# Patient Record
Sex: Male | Born: 1995
Health system: Southern US, Community
[De-identification: ages and names within clinical notes are randomized; demographics above are authoritative.]

## PROBLEM LIST (undated history)

## (undated) DIAGNOSIS — J45909 Unspecified asthma, uncomplicated: Secondary | ICD-10-CM

---
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
## 2017-06-27 ENCOUNTER — Other Ambulatory Visit
Admit: 2017-06-27 | Discharge: 2017-06-27 | Disposition: A | Discharge status: Home / Self Care | Payer: Worker's Compensation | Attending: Family Medicine | Admitting: Family Medicine

## 2017-12-10 ENCOUNTER — Emergency Department
Admission: EM | Admit: 2017-12-10 | Discharge: 2017-12-11 | Disposition: A | Discharge status: Home / Self Care | Payer: No Typology Code available for payment source | Attending: Emergency Medicine | Admitting: Emergency Medicine

## 2017-12-10 DIAGNOSIS — Y9389 Activity, other specified: Secondary | ICD-10-CM | POA: Diagnosis not present

## 2017-12-10 DIAGNOSIS — Y929 Unspecified place or not applicable: Secondary | ICD-10-CM | POA: Insufficient documentation

## 2017-12-10 DIAGNOSIS — Y990 Civilian activity done for income or pay: Secondary | ICD-10-CM | POA: Diagnosis not present

## 2017-12-10 DIAGNOSIS — Z87891 Personal history of nicotine dependence: Secondary | ICD-10-CM | POA: Diagnosis not present

## 2017-12-10 DIAGNOSIS — S70372A Other superficial bite of left thigh, initial encounter: Secondary | ICD-10-CM | POA: Insufficient documentation

## 2017-12-10 DIAGNOSIS — Z7721 Contact with and (suspected) exposure to potentially hazardous body fluids: Secondary | ICD-10-CM | POA: Diagnosis not present

## 2017-12-10 DIAGNOSIS — J45909 Unspecified asthma, uncomplicated: Secondary | ICD-10-CM | POA: Diagnosis not present

## 2017-12-10 DIAGNOSIS — W503XXA Accidental bite by another person, initial encounter: Secondary | ICD-10-CM

## 2017-12-10 HISTORY — DX: Unspecified asthma, uncomplicated: J45.909

## 2017-12-10 MED ORDER — AMOXICILLIN-POT CLAVULANATE 875-125 MG PO TABS
1.0000 | ORAL_TABLET | Freq: Once | ORAL | Status: AC
  Administered 2017-12-11: 1 via ORAL
  Filled 2017-12-10: qty 1
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
## 2017-12-11 LAB — CBC WITH DIFFERENTIAL/PLATELET
BASOS PCT: 1 %
Basophils Absolute: 0.1 10*3/uL (ref 0–0.1)
Eosinophils Absolute: 0.1 10*3/uL (ref 0–0.7)
Eosinophils Relative: 1 %
HCT: 46.1 % (ref 40.0–52.0)
Hemoglobin: 15.9 g/dL (ref 13.0–18.0)
Lymphocytes Relative: 23 %
Lymphs Abs: 2.2 10*3/uL (ref 1.0–3.6)
MCH: 31 pg (ref 26.0–34.0)
MCHC: 34.5 g/dL (ref 32.0–36.0)
MCV: 89.9 fL (ref 80.0–100.0)
MONO ABS: 0.5 10*3/uL (ref 0.2–1.0)
MONOS PCT: 5 %
Neutro Abs: 6.7 10*3/uL — ABNORMAL HIGH (ref 1.4–6.5)
Neutrophils Relative %: 70 %
Platelets: 237 10*3/uL (ref 150–440)
RBC: 5.13 MIL/uL (ref 4.40–5.90)
RDW: 13.2 % (ref 11.5–14.5)
WBC: 9.6 10*3/uL (ref 3.8–10.6)

## 2017-12-11 LAB — COMPREHENSIVE METABOLIC PANEL
ALBUMIN: 5.1 g/dL — AB (ref 3.5–5.0)
ALT: 31 U/L (ref 0–44)
ANION GAP: 10 (ref 5–15)
AST: 26 U/L (ref 15–41)
Alkaline Phosphatase: 63 U/L (ref 38–126)
BILIRUBIN TOTAL: 2 mg/dL — AB (ref 0.3–1.2)
BUN: 18 mg/dL (ref 6–20)
CALCIUM: 9.4 mg/dL (ref 8.9–10.3)
CHLORIDE: 104 mmol/L (ref 98–111)
CO2: 26 mmol/L (ref 22–32)
Creatinine, Ser: 1.38 mg/dL — ABNORMAL HIGH (ref 0.61–1.24)
Glucose, Bld: 96 mg/dL (ref 70–99)
POTASSIUM: 3.4 mmol/L — AB (ref 3.5–5.1)
Sodium: 140 mmol/L (ref 135–145)
Total Protein: 7.6 g/dL (ref 6.5–8.1)

## 2017-12-11 LAB — RAPID HIV SCREEN (HIV 1/2 AB+AG)
HIV 1/2 ANTIBODIES: NONREACTIVE
HIV-1 P24 Antigen - HIV24: NONREACTIVE

## 2017-12-11 MED ORDER — AMOXICILLIN-POT CLAVULANATE 875-125 MG PO TABS: 1.0000 | ORAL_TABLET | Freq: Two times a day (BID) | ORAL | 0 refills | Status: AC

## 2017-12-11 MED ORDER — BACITRACIN ZINC 500 UNIT/GM EX OINT
TOPICAL_OINTMENT | Freq: Once | CUTANEOUS | Status: AC
  Administered 2017-12-11: 1 via TOPICAL
  Filled 2017-12-11: qty 0.9

## 2017-12-11 NOTE — Discharge Instructions (Signed)
Please keep the wound on your left thigh clean and you may use a light coating of over-the-counter antibiotic ointment such as bacitracin and keep the wound covered up with a Band-Aid.  Take the prescribed antibiotics to try to prevent infection for the next 5 days.  Follow-up with your occupational health department regarding the testing that was performed tonight.  Return to the emergency department if you develop new or worsening symptoms that concern you.

## 2018-10-07 ENCOUNTER — Telehealth: Payer: Self-pay

## 2018-10-07 NOTE — Telephone Encounter (Signed)
Called and scheduled pt for post exposure labs

## 2018-10-11 ENCOUNTER — Other Ambulatory Visit: Payer: Self-pay

## 2018-10-11 DIAGNOSIS — Z7721 Contact with and (suspected) exposure to potentially hazardous body fluids: Secondary | ICD-10-CM

## 2018-10-11 NOTE — Progress Notes (Signed)
94month post exposure follow up labs.  AMD

## 2018-10-12 LAB — HIV ANTIBODY (ROUTINE TESTING W REFLEX): HIV Screen 4th Generation wRfx: NONREACTIVE

## 2018-10-12 LAB — HCV COMMENT:

## 2018-10-12 LAB — HEPATITIS C ANTIBODY (REFLEX): HCV Ab: <0.1 s/co ratio (ref 0.0–0.9)

## 2019-02-27 ENCOUNTER — Ambulatory Visit: Payer: Self-pay

## 2019-07-25 ENCOUNTER — Ambulatory Visit: Payer: Self-pay

## 2019-07-25 ENCOUNTER — Other Ambulatory Visit: Payer: Self-pay

## 2019-07-25 DIAGNOSIS — Z0000 Encounter for general adult medical examination without abnormal findings: Secondary | ICD-10-CM

## 2019-07-25 LAB — POCT URINALYSIS DIPSTICK
Bilirubin, UA: NEGATIVE
Blood, UA: NEGATIVE
Glucose, UA: NEGATIVE
Ketones, UA: NEGATIVE
Leukocytes, UA: NEGATIVE
Nitrite, UA: NEGATIVE
Protein, UA: NEGATIVE
Spec Grav, UA: 1.025 (ref 1.010–1.025)
Urobilinogen, UA: 0.2 E.U./dL
pH, UA: 6 (ref 5.0–8.0)

## 2019-07-25 NOTE — Progress Notes (Signed)
Presents for pre-employment drug screen. Specimen collected using [facility] Chain of Custody form for [facility] account number [id number]. Specimen ID [id number]  AMD

## 2019-07-28 LAB — CMP12+LP+TP+TSH+6AC+CBC/D/PLT
ALT: 24 IU/L (ref 0–44)
AST: 15 IU/L (ref 0–40)
Albumin/Globulin Ratio: 2.3 — ABNORMAL HIGH (ref 1.2–2.2)
Albumin: 4.8 g/dL (ref 4.1–5.2)
Alkaline Phosphatase: 64 IU/L (ref 48–121)
BUN/Creatinine Ratio: 17 (ref 9–20)
BUN: 19 mg/dL (ref 6–20)
Basophils Absolute: 0.1 10*3/uL (ref 0.0–0.2)
Basos: 1 %
Bilirubin Total: 2.1 mg/dL — ABNORMAL HIGH (ref 0.0–1.2)
Calcium: 9.3 mg/dL (ref 8.7–10.2)
Chloride: 98 mmol/L (ref 96–106)
Chol/HDL Ratio: 2.9 ratio (ref 0.0–5.0)
Cholesterol, Total: 135 mg/dL (ref 100–199)
Creatinine, Ser: 1.13 mg/dL (ref 0.76–1.27)
EOS (ABSOLUTE): 0.1 10*3/uL (ref 0.0–0.4)
Eos: 1 %
Estimated CHD Risk: <0.5 times avg. (ref 0.0–1.0)
Free Thyroxine Index: 1.5 (ref 1.2–4.9)
GFR calc Af Amer: 104 mL/min/{1.73_m2} (ref >59)
GFR calc non Af Amer: 90 mL/min/{1.73_m2} (ref >59)
GGT: 21 IU/L (ref 0–65)
Globulin, Total: 2.1 g/dL (ref 1.5–4.5)
Glucose: 97 mg/dL (ref 65–99)
HDL: 46 mg/dL (ref >39)
Hematocrit: 42.5 % (ref 37.5–51.0)
Hemoglobin: 15 g/dL (ref 13.0–17.7)
Immature Grans (Abs): 0 10*3/uL (ref 0.0–0.1)
Immature Granulocytes: 0 %
Iron: 94 ug/dL (ref 38–169)
LDH: 180 IU/L (ref 121–224)
LDL Chol Calc (NIH): 71 mg/dL (ref 0–99)
Lymphocytes Absolute: 1.5 10*3/uL (ref 0.7–3.1)
Lymphs: 20 %
MCH: 31.1 pg (ref 26.6–33.0)
MCHC: 35.3 g/dL (ref 31.5–35.7)
MCV: 88 fL (ref 79–97)
Monocytes Absolute: 0.4 10*3/uL (ref 0.1–0.9)
Monocytes: 5 %
Neutrophils Absolute: 5.5 10*3/uL (ref 1.4–7.0)
Neutrophils: 73 %
Phosphorus: 4.4 mg/dL — ABNORMAL HIGH (ref 2.8–4.1)
Platelets: 232 10*3/uL (ref 150–450)
Potassium: 3.9 mmol/L (ref 3.5–5.2)
RBC: 4.82 x10E6/uL (ref 4.14–5.80)
RDW: 12.4 % (ref 11.6–15.4)
Sodium: 138 mmol/L (ref 134–144)
T3 Uptake Ratio: 26 % (ref 24–39)
T4, Total: 5.7 ug/dL (ref 4.5–12.0)
TSH: 1.66 u[IU]/mL (ref 0.450–4.500)
Total Protein: 6.9 g/dL (ref 6.0–8.5)
Triglycerides: 97 mg/dL (ref 0–149)
Uric Acid: 4.7 mg/dL (ref 3.8–8.4)
VLDL Cholesterol Cal: 18 mg/dL (ref 5–40)
WBC: 7.6 10*3/uL (ref 3.4–10.8)

## 2019-07-28 LAB — QUANTIFERON-TB GOLD PLUS
QuantiFERON Mitogen Value: >10 IU/mL
QuantiFERON Nil Value: 0 IU/mL
QuantiFERON TB1 Ag Value: 0 IU/mL
QuantiFERON TB2 Ag Value: 0 IU/mL
QuantiFERON-TB Gold Plus: NEGATIVE

## 2019-08-01 ENCOUNTER — Other Ambulatory Visit: Payer: Self-pay

## 2019-08-01 ENCOUNTER — Encounter: Payer: Self-pay | Admitting: Emergency Medicine

## 2019-08-01 ENCOUNTER — Ambulatory Visit: Payer: Self-pay | Admitting: Emergency Medicine

## 2019-08-01 VITALS — BP 114/65 | HR 60 | Temp 96.0°F | Resp 12 | Ht 72.0 in | Wt 191.0 lb

## 2019-08-01 DIAGNOSIS — Z0000 Encounter for general adult medical examination without abnormal findings: Secondary | ICD-10-CM

## 2019-08-01 NOTE — Progress Notes (Signed)
[facility] Provider Note       Time seen: 2:30 PM   I have reviewed the vital signs and the nursing notes.  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 male with a history of asthma who presents today for annual physical examination.  He has no complaints, no recent illness.  Past Medical History:  Diagnosis Date  . Asthma     History reviewed. No pertinent surgical history.  Allergies Patient has no known allergies.  Review of Systems Constitutional: Negative for fever. Cardiovascular: Negative for chest pain. Respiratory: Negative for shortness of breath. Gastrointestinal: Negative for abdominal pain, vomiting and diarrhea. Musculoskeletal: Negative for back pain. Skin: Negative for rash. Neurological: Negative for headaches, focal weakness or numbness.  All systems negative/normal/unremarkable except as stated in the HPI  ____________________________________________   PHYSICAL EXAM:  VITAL SIGNS: Vitals:   08/01/19 1343  BP: 114/65  Pulse: 60  Resp: 12  Temp: (!) 96 F (35.6 C)  SpO2: 98%    Constitutional: Alert and oriented. Well appearing and in no distress. Eyes: Conjunctivae are normal. Normal extraocular movements. ENT      Head: Normocephalic and atraumatic.      Nose: No congestion/rhinnorhea.      Mouth/Throat: Mucous membranes are moist.      Neck: No stridor. Cardiovascular: Normal rate, regular rhythm. No murmurs, rubs, or gallops. Respiratory: Normal respiratory effort without tachypnea nor retractions. Breath sounds are clear and equal bilaterally. No wheezes/rales/rhonchi. Gastrointestinal: Soft and nontender. Normal bowel sounds Musculoskeletal: Nontender with normal range of motion in extremities. No lower extremity tenderness nor edema. Neurologic:  Normal speech and language. No gross focal neurologic  deficits are appreciated.  Skin:  Skin is warm, dry and intact. No rash noted. Psychiatric: Speech and behavior are normal.  ____________________________________________   LABS (pertinent positives/negatives)  Recent Results (from the past 2160 hour(s))  QuantiFERON-TB Gold Plus     Status: None   Collection Time: 07/25/19  2:13 PM  Result Value Ref Range   QuantiFERON Incubation Incubation performed.    QuantiFERON Criteria Comment     Comment: The QuantiFERON-TB Gold Plus result is determined by subtracting the Nil value from either TB antigen (Ag) tube. The mitogen tube serves as a control for the test.    QuantiFERON TB1 Ag Value 0.00 IU/mL   QuantiFERON TB2 Ag Value 0.00 IU/mL   QuantiFERON Nil Value 0.00 IU/mL   QuantiFERON Mitogen Value >10.00 IU/mL   QuantiFERON-TB Gold Plus Negative Negative    Comment: Chemiluminescence immunoassay methodology  CMP12+LP+TP+TSH+6AC+CBC/D/Plt     Status: Abnormal   Collection Time: 07/25/19  2:13 PM  Result Value Ref Range   Glucose 97 65 - 99 mg/dL   Uric Acid 4.7 3.8 - 8.4 mg/dL    Comment:            Therapeutic target for gout patients: <6.0   BUN 19 6 - 20 mg/dL   Creatinine, Ser 1.13 0.76 - 1.27 mg/dL   GFR calc non Af Amer 90 >59 mL/min/1.73   GFR calc Af Amer 104 >59 mL/min/1.73    Comment: **[facility] currently reports eGFR in compliance with the current**   recommendations of the [employer]. [facility] will   update reporting as new guidelines are published from the NKF-ASN   Task force.    BUN/Creatinine Ratio 17 9 - 20  Sodium 138 134 - 144 mmol/L   Potassium 3.9 3.5 - 5.2 mmol/L   Chloride 98 96 - 106 mmol/L   Calcium 9.3 8.7 - 10.2 mg/dL   Phosphorus 4.4 (H) 2.8 - 4.1 mg/dL   Total Protein 6.9 6.0 - 8.5 g/dL   Albumin 4.8 4.1 - 5.2 g/dL   Globulin, Total 2.1 1.5 - 4.5 g/dL   Albumin/Globulin Ratio 2.3 (H) 1.2 - 2.2   Bilirubin Total 2.1 (H) 0.0 - 1.2 mg/dL   Alkaline Phosphatase 64 48 - 121 IU/L     Comment:               **Please note reference interval change**   LDH 180 121 - 224 IU/L   AST 15 0 - 40 IU/L   ALT 24 0 - 44 IU/L   GGT 21 0 - 65 IU/L   Iron 94 38 - 169 ug/dL   Cholesterol, Total 135 100 - 199 mg/dL   Triglycerides 97 0 - 149 mg/dL   HDL 46 >39 mg/dL   VLDL Cholesterol Cal 18 5 - 40 mg/dL   LDL Chol Calc (NIH) 71 0 - 99 mg/dL   Chol/HDL Ratio 2.9 0.0 - 5.0 ratio    Comment:                                   T. Chol/HDL Ratio                                             Men  Women                               1/2 Avg.Risk  3.4    3.3                                   Avg.Risk  5.0    4.4                                2X Avg.Risk  9.6    7.1                                3X Avg.Risk 23.4   11.0    Estimated CHD Risk  < 0.5 0.0 - 1.0 times avg.    Comment: The CHD Risk is based on the T. Chol/HDL ratio. Other factors affect CHD Risk such as hypertension, smoking, diabetes, severe obesity, and family history of premature CHD.    TSH 1.660 0.450 - 4.500 uIU/mL   T4, Total 5.7 4.5 - 12.0 ug/dL   T3 Uptake Ratio 26 24 - 39 %   Free Thyroxine Index 1.5 1.2 - 4.9   WBC 7.6 3.4 - 10.8 x10E3/uL   RBC 4.82 4.14 - 5.80 x10E6/uL   Hemoglobin 15.0 13.0 - 17.7 g/dL   Hematocrit 42.5 37.5 - 51.0 %   MCV 88 79 - 97 fL   MCH 31.1 26.6 - 33.0 pg   MCHC 35.3 31.5 - 35.7 g/dL   RDW 12.4 11.6 - 15.4 %  Platelets 232 150 - 450 x10E3/uL   Neutrophils 73 Not Estab. %   Lymphs 20 Not Estab. %   Monocytes 5 Not Estab. %   Eos 1 Not Estab. %   Basos 1 Not Estab. %   Neutrophils Absolute 5.5 1.4 - 7.0 x10E3/uL   Lymphocytes Absolute 1.5 0.7 - 3.1 x10E3/uL   Monocytes Absolute 0.4 0.1 - 0.9 x10E3/uL   EOS (ABSOLUTE) 0.1 0.0 - 0.4 x10E3/uL   Basophils Absolute 0.1 0.0 - 0.2 x10E3/uL   Immature Granulocytes 0 Not Estab. %   Immature Grans (Abs) 0.0 0.0 - 0.1 x10E3/uL  POCT urinalysis dipstick     Status: None   Collection Time: 07/25/19  3:38 PM  Result Value Ref Range    Color, UA Yellow    Clarity, UA Clear    Glucose, UA Negative Negative   Bilirubin, UA Negative    Ketones, UA Negative    Spec Grav, UA 1.025 1.010 - 1.025   Blood, UA Negative    pH, UA 6.0 5.0 - 8.0   Protein, UA Negative Negative   Urobilinogen, UA 0.2 0.2 or 1.0 E.U./dL   Nitrite, UA Negative    Leukocytes, UA Negative Negative   Appearance     Odor      DIFFERENTIAL DIAGNOSIS  Annual physical examination  ASSESSMENT AND PLAN  Annual physical examination   Plan: The patient had presented for physical examination without complaints. Patient's labs did not reveal any acute process.  His total bilirubin is slightly elevated but appears to be likely chronic.  He is cleared for follow-up as needed.  Lenise Arena MD    Note: This note was generated in part or whole with voice recognition software. Voice recognition is usually quite accurate but there are transcription errors that can and very often do occur. I apologize for any typographical errors that were not detected and corrected.

## 2020-03-05 ENCOUNTER — Encounter: Payer: Self-pay | Admitting: Physician Assistant

## 2020-03-05 ENCOUNTER — Other Ambulatory Visit: Payer: Self-pay

## 2020-03-05 ENCOUNTER — Ambulatory Visit: Payer: Self-pay | Admitting: Physician Assistant

## 2020-03-05 VITALS — Ht 72.0 in | Wt 190.0 lb

## 2020-03-05 DIAGNOSIS — B9689 Other specified bacterial agents as the cause of diseases classified elsewhere: Secondary | ICD-10-CM

## 2020-03-05 MED ORDER — SULFAMETHOXAZOLE-TRIMETHOPRIM 800-160 MG PO TABS: 1.0000 | ORAL_TABLET | Freq: Two times a day (BID) | ORAL | 0 refills | Status: DC

## 2020-03-05 MED ORDER — NEOSPORIN PLUS PAIN RELIEF MS 3.5-10000-10 EX CREA: TOPICAL_CREAM | Freq: Two times a day (BID) | CUTANEOUS | 0 refills | Status: DC

## 2020-03-05 NOTE — Progress Notes (Signed)
p 

## 2020-03-05 NOTE — Progress Notes (Signed)
° °  Subjective: Skin infection    Patient ID: Justin Brooks, male    DOB: 1995/08/28, 24 y.o.   MRN: [id number]  HPI Patient presents with patient to shaft of penis. Patient is a ongoing problem for a week and a half after trying on new underwear his wife purchased for him. Patient states the size is too small be continue to wear him to he develop an abrasion.   Review of Systems    Negative except for complaint. Objective:   Physical Exam No acute distress. Mild erythema with an abrasion invasion to the lateral aspect of the proximal penile shaft.       Assessment & Plan: Skin infection  Patient given discharge care instruction. Patient given a prescription for Bactrim DS and Neosporin. Patient advised to follow-up with noticed no improvement in 1 week.

## 2020-05-28 ENCOUNTER — Ambulatory Visit: Payer: Self-pay | Admitting: Nurse Practitioner

## 2020-05-28 ENCOUNTER — Other Ambulatory Visit: Payer: Self-pay

## 2020-05-28 ENCOUNTER — Encounter: Payer: Self-pay | Admitting: Nurse Practitioner

## 2020-05-28 VITALS — BP 124/81 | HR 72 | Temp 98.2°F | Resp 12

## 2020-05-28 DIAGNOSIS — L0591 Pilonidal cyst without abscess: Secondary | ICD-10-CM

## 2020-05-28 MED ORDER — SULFAMETHOXAZOLE-TRIMETHOPRIM 800-160 MG PO TABS: 1.0000 | ORAL_TABLET | Freq: Two times a day (BID) | ORAL | 0 refills | Status: AC

## 2020-05-28 NOTE — Progress Notes (Signed)
   Subjective:    Patient ID: Justin Brooks, male    DOB: 1995/05/11, 25 y.o.   MRN: [id number]  HPI  25 year old male presenting with 7 days of pain to rectal area. He denies constipation or diarrhea, denies any blood with bowel movements or straining. He works out regularly, bikes 10 miles a day, and [employer]. Denies any changes to exercise routine.   He notes he gets frequent skin infections and washes with "hospital grade soap" once a week.   He has been using a donut to sit on and has been using Preparation H to area bothering him. He believes he has felt a "bump"  Today's Vitals   05/28/20 1626  BP: 124/81  Pulse: 72  Resp: 12  Temp: 98.2 F (36.8 C)  TempSrc: Oral  SpO2: 98%   There is no height or weight on file to calculate BMI.   Review of Systems  Constitutional: Negative.   HENT: Negative.   Respiratory: Negative.   Cardiovascular: Negative.   Gastrointestinal: Positive for rectal pain. Negative for blood in stool.  Genitourinary: Negative.   Musculoskeletal: Negative.    Past Medical History:  Diagnosis Date  . Asthma       Objective:   Physical Exam Genitourinary:    Rectum: Normal.       Comments: Tender area approximately 1.5-2 inches above rectum. Tracking to the left lateral area approximately one inch. No raised abscess present. Hair to region thick. Tender area and surrounding skin WNL.  Neurological:     General: No focal deficit present.     Mental Status: He is alert.  Psychiatric:        Mood and Affect: Mood normal.           Assessment & Plan:  Likely cause of pain to area caused by deep pilonidal cyst. Advised patient to stop Prep H, may use ibuprofen for pain relief up to 600mg  every 6 hours with food. Warm Epson salt baths. Start antibiotic. If no improvement RTC as discussed for re assessment. If recurrent or no improvement on antibiotic without surface layer abscess for drainage consider referral to Gen.Surg   Meds  ordered this encounter  Medications  . sulfamethoxazole-trimethoprim (BACTRIM DS) 800-160 MG tablet    Sig: Take 1 tablet by mouth 2 (two) times daily for 10 days.    Dispense:  20 tablet    Refill:  0

## 2020-05-29 NOTE — Patient Instructions (Signed)
Pilonidal Cyst  A pilonidal cyst is a fluid-filled sac that forms beneath the skin near the tailbone, at the top of the crease of the buttocks (pilonidal area). If the cyst is not large and not infected, it may not cause any problems. If the cyst becomes irritated or infected, it may get larger and fill with pus. An infected cyst is called an abscess. A pilonidal abscess may cause pain and swelling, and it may need to be drained or removed. What are the causes? The cause of this condition is not always known. In some cases, a hair that grows into your skin (ingrown hair) may be the cause. What increases the risk? You are more likely to get a pilonidal cyst if you:  Are male.  Have lots of hair near the crease of the buttocks.  Are overweight.  Have a dimple near the crease of the buttocks.  Wear tight clothing.  Do not bathe or shower often.  Sit for long periods of time. What are the signs or symptoms? Signs and symptoms of a pilonidal cyst may include pain, swelling, redness, and warmth in the pilonidal area. Depending on how big the cyst is, you may be able to feel a lump near your tailbone. If your cyst becomes infected, symptoms may include:  Pus or fluid drainage.  Fever.  Pain, swelling, and redness getting worse.  The lump getting bigger. How is this diagnosed? This condition may be diagnosed based on:  Your symptoms and medical history.  A physical exam.  A blood test to check for infection.  Testing a pus sample, if applicable. How is this treated? If your cyst does not cause symptoms, you may not need any treatment. If your cyst bothers you or is infected, you may need a procedure to drain or remove the cyst. Depending on the size, location, and severity of your cyst, your health care provider may:  Make an incision in the cyst and drain it (incision and drainage).  Open and drain the cyst, and then stitch the wound so that it stays open while it heals  (marsupialization). You will be given instructions about how to care for your open wound while it heals.  Remove all or part of the cyst, and then close the wound (cyst removal). You may need to take antibiotic medicines before your procedure. Follow these instructions at home: Medicines  Take over-the-counter and prescription medicines only as told by your health care provider.  If you were prescribed an antibiotic medicine, take it as told by your health care provider. Do not stop taking the antibiotic even if you start to feel better. General instructions  Keep the area around your pilonidal cyst clean and dry.  If there is fluid or pus draining from your cyst: ? Cover the area with a clean bandage (dressing) as needed. ? Wash the area gently with soap and water. Pat the area dry with a clean towel. Do not rub the area because that may cause bleeding.  Remove hair from the area around the cyst only if your health care provider tells you to do this.  Do not wear tight pants or sit in one position for long periods at a time.  Keep all follow-up visits as told by your health care provider. This is important. Contact a health care provider if you have:  New redness, swelling, or pain.  A pilonidal cyst is a fluid-filled sac that forms beneath the skin near the tailbone, at the top   of the crease of the buttocks (pilonidal area).  If the cyst becomes irritated or infected, it may get larger and fill with pus. An infected cyst is called an abscess.  The cause of this condition is not always known. In some cases, a hair that grows into your skin (ingrown hair) may be the cause.  If your cyst does not cause symptoms, you may not need any treatment. If your cyst bothers you or is infected, you may need a procedure to drain or remove the cyst. This information is not intended to replace advice given to you by your health care provider. Make sure you discuss any questions you have with your  health care provider. Document Revised: 02/11/2017 Document Reviewed: 02/11/2017 Elsevier Patient Education  2021 [employer]vier Inc.  

## 2020-06-04 ENCOUNTER — Encounter: Payer: Self-pay | Admitting: Physician Assistant

## 2020-06-04 ENCOUNTER — Other Ambulatory Visit: Payer: Self-pay

## 2020-06-04 ENCOUNTER — Ambulatory Visit: Payer: Self-pay | Admitting: Physician Assistant

## 2020-06-04 VITALS — BP 130/88 | HR 93 | Temp 97.8°F

## 2020-06-04 DIAGNOSIS — L0591 Pilonidal cyst without abscess: Secondary | ICD-10-CM

## 2020-06-04 DIAGNOSIS — L0501 Pilonidal cyst with abscess: Secondary | ICD-10-CM

## 2020-06-04 HISTORY — DX: Pilonidal cyst with abscess: L05.01

## 2020-06-04 MED ORDER — OXYCODONE-ACETAMINOPHEN 7.5-325 MG PO TABS: 1.0000 | ORAL_TABLET | Freq: Four times a day (QID) | ORAL | 0 refills | Status: DC | PRN

## 2020-06-04 NOTE — Progress Notes (Signed)
   Subjective: Pilonidal cyst    Patient ID: Justin Brooks, male    DOB: May 08, 1995, 25 y.o.   MRN: [id number]  HPI Patient presents with pilonidal cyst with minimal drainage.  Patient has been on 8 days of antibiotics and been performing sitz bath's.  Patient stated no fever associated complaint.  Patient pain increased with sitting.  Is using a donut cushion to assist with sitting.   Review of Systems    Negative septal complaint. Objective:   Physical Exam Patient has a fluctuant nodule lesion with mild drainage pilonidal area.       Assessment & Plan: Incision and drainage of pilonidal cyst.  Patient consented to procedure.  Local block using lidocaine with epinephrine.  8 cc was used to achieve anesthesia.  Using a #11 blade incision was made at the drainage site.  Copious purulent material expressed.  Area was irrigated with normal saline.  Iodoform gauze inserted.  Patient given discharge care instructions and a prescription for Percocets.  Patient placed on light duty at home and advised return back in 2 days to have packing material removed.  Advised to complete antibiotics.

## 2020-06-05 ENCOUNTER — Ambulatory Visit: Payer: Self-pay

## 2020-06-06 ENCOUNTER — Ambulatory Visit: Payer: Self-pay | Admitting: Emergency Medicine

## 2020-06-06 ENCOUNTER — Other Ambulatory Visit: Payer: Self-pay

## 2020-06-06 ENCOUNTER — Encounter: Payer: Self-pay | Admitting: Emergency Medicine

## 2020-06-06 VITALS — BP 135/69 | HR 62 | Temp 98.3°F | Resp 12

## 2020-06-06 DIAGNOSIS — Z5189 Encounter for other specified aftercare: Secondary | ICD-10-CM

## 2020-06-06 DIAGNOSIS — L0591 Pilonidal cyst without abscess: Secondary | ICD-10-CM

## 2020-06-06 NOTE — Progress Notes (Signed)
  Subjective:     Patient ID: Justin Brooks, male   DOB: [birth year], 25 y.o.   MRN: [id number]  HPI Encounter for recheck I & D from 06/04/20.  Pilonidal abscess.    Review of Systems     Objective:   Physical Exam Packing fell out prior to arrival to Clinic.  Minimal drainage and patient reports decreased pain and never took the Percocet.      Assessment:     Improved Pilonidal abscess    Plan:     Patient is aware that most likely this pilonidal cyst can become infected again in the future.  Would like to be referred to a surgeon for evaluation and possible surgical removal of the cyst as he works as a Emergency planning/management officer and wants to minimize any future episodes and loss of work.  Patient will continue taking antibiotic until completely finished.  Arrangements will be made for him to be referred to Dr. Doristine Counter.

## 2020-06-06 NOTE — Addendum Note (Signed)
Addended by: Gardner Candle on: 06/06/2020 04:51 PM   Modules accepted: Orders

## 2020-07-02 DIAGNOSIS — L0591 Pilonidal cyst without abscess: Secondary | ICD-10-CM | POA: Diagnosis not present

## 2020-08-15 NOTE — Progress Notes (Deleted)
Scheduled to complete physical 08/22/20 with Blima Ledger, NP-C.  AMD

## 2020-08-23 ENCOUNTER — Ambulatory Visit: Payer: Self-pay

## 2020-08-23 ENCOUNTER — Other Ambulatory Visit: Payer: Self-pay

## 2020-08-23 DIAGNOSIS — Z0000 Encounter for general adult medical examination without abnormal findings: Secondary | ICD-10-CM

## 2020-08-23 NOTE — Progress Notes (Signed)
Pt presents today to complete labs for annual physical scheduled on 08/26/20 with Ron Smith,PA-C.  Reviewed CDC recommendations for importance of the COVID-19 Vaccine. Pt has declined the vaccine today and for the future.

## 2020-08-24 LAB — CMP12+LP+TP+TSH+6AC+CBC/D/PLT
ALT: 38 IU/L (ref 0–44)
AST: 22 IU/L (ref 0–40)
Albumin/Globulin Ratio: 2.4 — ABNORMAL HIGH (ref 1.2–2.2)
Albumin: 4.8 g/dL (ref 4.1–5.2)
Alkaline Phosphatase: 80 IU/L (ref 44–121)
BUN/Creatinine Ratio: 27 — ABNORMAL HIGH (ref 9–20)
BUN: 22 mg/dL — ABNORMAL HIGH (ref 6–20)
Basophils Absolute: 0.1 10*3/uL (ref 0.0–0.2)
Basos: 1 %
Bilirubin Total: 2.7 mg/dL — ABNORMAL HIGH (ref 0.0–1.2)
Calcium: 9.1 mg/dL (ref 8.7–10.2)
Chloride: 98 mmol/L (ref 96–106)
Chol/HDL Ratio: 3.4 ratio (ref 0.0–5.0)
Cholesterol, Total: 159 mg/dL (ref 100–199)
Creatinine, Ser: 0.82 mg/dL (ref 0.76–1.27)
EOS (ABSOLUTE): 0.3 10*3/uL (ref 0.0–0.4)
Eos: 4 %
Estimated CHD Risk: 0.5 times avg. (ref 0.0–1.0)
Free Thyroxine Index: 2 (ref 1.2–4.9)
GGT: 18 IU/L (ref 0–65)
Globulin, Total: 2 g/dL (ref 1.5–4.5)
Glucose: 90 mg/dL (ref 65–99)
HDL: 47 mg/dL (ref >39)
Hematocrit: 46 % (ref 37.5–51.0)
Hemoglobin: 16.4 g/dL (ref 13.0–17.7)
Immature Grans (Abs): 0 10*3/uL (ref 0.0–0.1)
Immature Granulocytes: 0 %
Iron: 85 ug/dL (ref 38–169)
LDH: 175 IU/L (ref 121–224)
LDL Chol Calc (NIH): 99 mg/dL (ref 0–99)
Lymphocytes Absolute: 2.5 10*3/uL (ref 0.7–3.1)
Lymphs: 34 %
MCH: 31.2 pg (ref 26.6–33.0)
MCHC: 35.7 g/dL (ref 31.5–35.7)
MCV: 88 fL (ref 79–97)
Monocytes Absolute: 0.6 10*3/uL (ref 0.1–0.9)
Monocytes: 8 %
Neutrophils Absolute: 3.8 10*3/uL (ref 1.4–7.0)
Neutrophils: 53 %
Phosphorus: 3.7 mg/dL (ref 2.8–4.1)
Platelets: 237 10*3/uL (ref 150–450)
Potassium: 4.1 mmol/L (ref 3.5–5.2)
RBC: 5.25 x10E6/uL (ref 4.14–5.80)
RDW: 12.6 % (ref 11.6–15.4)
Sodium: 137 mmol/L (ref 134–144)
T3 Uptake Ratio: 29 % (ref 24–39)
T4, Total: 6.8 ug/dL (ref 4.5–12.0)
TSH: 1.82 u[IU]/mL (ref 0.450–4.500)
Total Protein: 6.8 g/dL (ref 6.0–8.5)
Triglycerides: 65 mg/dL (ref 0–149)
Uric Acid: 4.5 mg/dL (ref 3.8–8.4)
VLDL Cholesterol Cal: 13 mg/dL (ref 5–40)
WBC: 7.3 10*3/uL (ref 3.4–10.8)
eGFR: 124 mL/min/{1.73_m2} (ref >59)

## 2020-08-26 ENCOUNTER — Encounter: Payer: Self-pay | Admitting: Physician Assistant

## 2020-08-29 ENCOUNTER — Other Ambulatory Visit: Payer: Self-pay

## 2020-08-29 ENCOUNTER — Encounter: Payer: Self-pay | Admitting: Adult Health

## 2020-08-29 ENCOUNTER — Ambulatory Visit: Payer: Self-pay | Admitting: Adult Health

## 2020-08-29 VITALS — BP 117/72 | HR 60 | Temp 98.6°F | Resp 14 | Ht 72.0 in | Wt 195.0 lb

## 2020-08-29 DIAGNOSIS — R17 Unspecified jaundice: Secondary | ICD-10-CM

## 2020-08-29 DIAGNOSIS — R799 Abnormal finding of blood chemistry, unspecified: Secondary | ICD-10-CM

## 2020-08-29 DIAGNOSIS — Z0000 Encounter for general adult medical examination without abnormal findings: Secondary | ICD-10-CM

## 2020-08-29 NOTE — Progress Notes (Signed)
[facility] [address] [place], [place] [address]  Internal MEDICINE  Office Visit Note  Patient Name: Justin Brooks  [id number]  [id number]  Date of Service: 08/29/2020  Chief Complaint  Patient presents with   Annual Exam     HPI Pt is here for routine health maintenance examination.  He is a well appearing 25 yo [employer] for [employer]. He has worked for [employer] for about 5 years.  He is currently engaged to another [employer].  They do not have any children.  He has a history of ADHD, he stopped taking Ritalin in High school, and manages without medication.  He works out 6 days a week, and runs 6-10 miles a week.  He uses smokeless tobacco, 1 can per day.  He does not drink or use any illicit drugs.  He denies any concerns at this time.     Current Medication: Outpatient Encounter Medications as of 08/29/2020  Medication Sig   [DISCONTINUED] neomycin-polymyxin-pramoxine (NEOSPORIN PLUS) 1 % cream Apply topically 2 (two) times daily.   [DISCONTINUED] oxyCODONE-acetaminophen (PERCOCET) 7.5-325 MG tablet Take 1 tablet by mouth every 6 (six) hours as needed for severe pain. (Patient not taking: Reported on 06/06/2020)   No facility-administered encounter medications on file as of 08/29/2020.    Surgical History: History reviewed. No pertinent surgical history.  Medical History: Past Medical History:  Diagnosis Date   Asthma    Pilonidal cyst with abscess 06/04/2020    Family History: History reviewed. No pertinent family history.  Social History: Social History   Socioeconomic History   Marital status: Single    Spouse name: Not on file   Number of children: Not on file   Years of education: Not on file   Highest education level: Not on file  Occupational History   Not on file  Tobacco Use   Smoking status: Former    Pack years: 0.00    Types: Cigarettes   Smokeless tobacco: Current    Types: Chew  Substance and Sexual Activity   Alcohol use: Not Currently   Drug use:  Not on file   Sexual activity: Not on file  Other Topics Concern   Not on file  Social History Narrative   Not on file   Social Determinants of Health   Financial Resource Strain: Not on file  Food Insecurity: Not on file  Transportation Needs: Not on file  Physical Activity: Not on file  Stress: Not on file  Social Connections: Not on file      Review of Systems  Constitutional:  Negative for activity change, appetite change and fatigue.  HENT:  Negative for congestion, sinus pain, trouble swallowing and voice change.   Eyes:  Negative for pain, discharge and visual disturbance.  Respiratory:  Negative for cough, chest tightness and shortness of breath.   Cardiovascular:  Negative for chest pain and leg swelling.  Gastrointestinal:  Negative for abdominal distention, abdominal pain, constipation and diarrhea.  Genitourinary:  Negative for difficulty urinating, genital sores, hematuria, penile discharge and scrotal swelling.  Musculoskeletal:  Negative for arthralgias, back pain and neck pain.  Skin:  Negative for color change.  Neurological:  Negative for dizziness, weakness and headaches.  Hematological:  Negative for adenopathy.  Psychiatric/Behavioral:  Negative for agitation, confusion and suicidal ideas.     Vital Signs: BP 117/72   Pulse 60   Temp 98.6 F (37 C)   Resp 14   Ht 6' (1.829 m)   Wt 195  lb (88.5 kg)   SpO2 97%   BMI 26.45 kg/m    Physical Exam Constitutional:      Appearance: Normal appearance.  HENT:     Head: Normocephalic.     Right Ear: Tympanic membrane normal.     Left Ear: Tympanic membrane normal.     Nose: Nose normal.     Mouth/Throat:     Mouth: Mucous membranes are moist.     Pharynx: No oropharyngeal exudate or posterior oropharyngeal erythema.  Eyes:     General:        Right eye: No discharge.        Left eye: No discharge.     Extraocular Movements: Extraocular movements intact.     Pupils: Pupils are equal, round, and  reactive to light.  Cardiovascular:     Rate and Rhythm: Normal rate and regular rhythm.     Pulses: Normal pulses.     Heart sounds: Normal heart sounds. No murmur heard. Pulmonary:     Effort: Pulmonary effort is normal. No respiratory distress.     Breath sounds: Normal breath sounds. No wheezing or rhonchi.  Abdominal:     General: Abdomen is flat. Bowel sounds are normal. There is no distension.     Palpations: There is no mass.     Tenderness: There is no abdominal tenderness. There is no guarding.     Hernia: No hernia is present. There is no hernia in the left inguinal area or right inguinal area.  Genitourinary:    Penis: Normal and circumcised. No phimosis, hypospadias, tenderness or swelling.      Testes:        Right: Mass, tenderness, swelling or varicocele not present.        Left: Mass, tenderness, swelling or varicocele not present.     Epididymis:     Right: Not inflamed or enlarged. No mass or tenderness.     Left: Not inflamed or enlarged. No mass or tenderness.  Musculoskeletal:        General: No swelling or deformity. Normal range of motion.     Cervical back: Normal range of motion.  Lymphadenopathy:     Lower Body: No right inguinal adenopathy. No left inguinal adenopathy.  Skin:    General: Skin is warm and dry.     Capillary Refill: Capillary refill takes less than 2 seconds.  Neurological:     General: No focal deficit present.     Mental Status: He is alert.     Cranial Nerves: No cranial nerve deficit.     Gait: Gait normal.  Psychiatric:        Mood and Affect: Mood normal.        Behavior: Behavior normal.        Judgment: Judgment normal.     LABS: Recent Results (from the past 2160 hour(s))  CMP12+LP+TP+TSH+6AC+CBC/D/Plt     Status: Abnormal   Collection Time: 08/23/20  8:42 AM  Result Value Ref Range   Glucose 90 65 - 99 mg/dL   Uric Acid 4.5 3.8 - 8.4 mg/dL    Comment:            Therapeutic target for gout patients: <6.0   BUN 22  (H) 6 - 20 mg/dL   Creatinine, Ser 0.82 0.76 - 1.27 mg/dL   eGFR 124 >59 mL/min/1.73   BUN/Creatinine Ratio 27 (H) 9 - 20   Sodium 137 134 - 144 mmol/L   Potassium 4.1 3.5 - 5.2  mmol/L   Chloride 98 96 - 106 mmol/L   Calcium 9.1 8.7 - 10.2 mg/dL   Phosphorus 3.7 2.8 - 4.1 mg/dL   Total Protein 6.8 6.0 - 8.5 g/dL   Albumin 4.8 4.1 - 5.2 g/dL   Globulin, Total 2.0 1.5 - 4.5 g/dL   Albumin/Globulin Ratio 2.4 (H) 1.2 - 2.2   Bilirubin Total 2.7 (H) 0.0 - 1.2 mg/dL   Alkaline Phosphatase 80 44 - 121 IU/L   LDH 175 121 - 224 IU/L   AST 22 0 - 40 IU/L   ALT 38 0 - 44 IU/L   GGT 18 0 - 65 IU/L   Iron 85 38 - 169 ug/dL   Cholesterol, Total 159 100 - 199 mg/dL   Triglycerides 65 0 - 149 mg/dL   HDL 47 >39 mg/dL   VLDL Cholesterol Cal 13 5 - 40 mg/dL   LDL Chol Calc (NIH) 99 0 - 99 mg/dL   Chol/HDL Ratio 3.4 0.0 - 5.0 ratio    Comment:                                   T. Chol/HDL Ratio                                             Men  Women                               1/2 Avg.Risk  3.4    3.3                                   Avg.Risk  5.0    4.4                                2X Avg.Risk  9.6    7.1                                3X Avg.Risk 23.4   11.0    Estimated CHD Risk 0.5 0.0 - 1.0 times avg.    Comment: The CHD Risk is based on the T. Chol/HDL ratio. Other factors affect CHD Risk such as hypertension, smoking, diabetes, severe obesity, and family history of premature CHD.    TSH 1.820 0.450 - 4.500 uIU/mL   T4, Total 6.8 4.5 - 12.0 ug/dL   T3 Uptake Ratio 29 24 - 39 %   Free Thyroxine Index 2.0 1.2 - 4.9   WBC 7.3 3.4 - 10.8 x10E3/uL   RBC 5.25 4.14 - 5.80 x10E6/uL   Hemoglobin 16.4 13.0 - 17.7 g/dL   Hematocrit 46.0 37.5 - 51.0 %   MCV 88 79 - 97 fL   MCH 31.2 26.6 - 33.0 pg   MCHC 35.7 31.5 - 35.7 g/dL   RDW 12.6 11.6 - 15.4 %   Platelets 237 150 - 450 x10E3/uL   Neutrophils 53 Not Estab. %   Lymphs 34 Not Estab. %   Monocytes 8 Not Estab. %   Eos 4 Not Estab.  %   Basos  1 Not Estab. %   Neutrophils Absolute 3.8 1.4 - 7.0 x10E3/uL   Lymphocytes Absolute 2.5 0.7 - 3.1 x10E3/uL   Monocytes Absolute 0.6 0.1 - 0.9 x10E3/uL   EOS (ABSOLUTE) 0.3 0.0 - 0.4 x10E3/uL   Basophils Absolute 0.1 0.0 - 0.2 x10E3/uL   Immature Granulocytes 0 Not Estab. %   Immature Grans (Abs) 0.0 0.0 - 0.1 x10E3/uL     Assessment/Plan: 1. Routine adult health maintenance Up to date on PHM.   2. Total bilirubin, elevated 2.7 today, was 2.1 last year. Discussed elevated T bilirubin.  Patient takes multiple supplements to aid in working out.  Will recheck in 5 months.   3. Elevated BUN 22 this year, 19 last year.  Will recheck in 5 months.      General Counseling: Calib verbalizes understanding of the findings of todays visit and agrees with plan of treatment. I have discussed any further diagnostic evaluation that may be needed or ordered today. We also reviewed his medications today. he has been encouraged to call the office with any questions or concerns that should arise related to todays visit.    No orders of the defined types were placed in this encounter.   No orders of the defined types were placed in this encounter.   Total time spent:40 Minutes  Time spent includes review of chart, medications, test results, and follow up plan with the patient.    Kendell Bane AGNP-C Nurse Practitioner

## 2020-08-29 NOTE — Progress Notes (Signed)
Pt presents today to complete physical with Mila Merry

## 2021-01-28 ENCOUNTER — Other Ambulatory Visit: Payer: 59

## 2021-07-17 ENCOUNTER — Ambulatory Visit: Payer: Self-pay | Admitting: Physician Assistant

## 2021-07-17 ENCOUNTER — Encounter: Payer: Self-pay | Admitting: Physician Assistant

## 2021-07-17 DIAGNOSIS — B9689 Other specified bacterial agents as the cause of diseases classified elsewhere: Secondary | ICD-10-CM

## 2021-07-17 MED ORDER — SULFAMETHOXAZOLE-TRIMETHOPRIM 800-160 MG PO TABS: 1.0000 | ORAL_TABLET | Freq: Two times a day (BID) | ORAL | 0 refills | Status: DC

## 2021-07-17 NOTE — Progress Notes (Signed)
? ?  Subjective: Skin infection  ? ? Patient ID: Justin Brooks, male    DOB: [birth year], 26 y.o.   MRN: [id number] ? ?HPI ?Patient presents with erythema and mild edema to left forearm status post tattoo.  Patient denies fever associated complaint.  Patient denies loss sensation or loss of function.  Patient is right-hand dominant. ? ? ?Review of Systems ?Negative except for chief complaint ?   ?Objective:  ? Physical Exam ? ?BP is 120/77, temperature 97.8, pulse 85, patient is 97% O2 sat on room air.  Patient weighs 200 pounds. ?Examination of the left forearm shows mild edema and pustular lesions. ? ? ?   ?Assessment & Plan: Skin infection  ?Patient given prescription for Bactrim DS to take twice a day for 10 days.  Return back in 10 days if no improvement or worsening complaint. ? ?

## 2021-09-01 DIAGNOSIS — Z0000 Encounter for general adult medical examination without abnormal findings: Secondary | ICD-10-CM

## 2021-09-03 ENCOUNTER — Ambulatory Visit: Payer: Self-pay

## 2021-09-03 ENCOUNTER — Encounter: Payer: 59 | Admitting: Physician Assistant

## 2021-09-03 DIAGNOSIS — Z0000 Encounter for general adult medical examination without abnormal findings: Secondary | ICD-10-CM

## 2021-09-03 NOTE — Progress Notes (Signed)
Pt presents today for physical labs, will return to clinic for scheduled physical.  

## 2021-09-04 ENCOUNTER — Ambulatory Visit: Payer: Self-pay | Admitting: Physician Assistant

## 2021-09-04 ENCOUNTER — Encounter: Payer: Self-pay | Admitting: Physician Assistant

## 2021-09-04 VITALS — BP 135/76 | HR 78 | Temp 97.8°F | Resp 16 | Ht 72.0 in | Wt 205.0 lb

## 2021-09-04 DIAGNOSIS — R17 Unspecified jaundice: Secondary | ICD-10-CM

## 2021-09-04 DIAGNOSIS — Z0000 Encounter for general adult medical examination without abnormal findings: Secondary | ICD-10-CM

## 2021-09-04 DIAGNOSIS — Z23 Encounter for immunization: Secondary | ICD-10-CM

## 2021-09-04 LAB — CMP12+LP+TP+TSH+6AC+CBC/D/PLT
ALT: 37 IU/L (ref 0–44)
AST: 21 IU/L (ref 0–40)
Albumin/Globulin Ratio: 2.5 — ABNORMAL HIGH (ref 1.2–2.2)
Albumin: 4.8 g/dL (ref 4.1–5.2)
Alkaline Phosphatase: 93 IU/L (ref 44–121)
BUN/Creatinine Ratio: 14 (ref 9–20)
BUN: 12 mg/dL (ref 6–20)
Basophils Absolute: 0 10*3/uL (ref 0.0–0.2)
Basos: 0 %
Bilirubin Total: 3.5 mg/dL — ABNORMAL HIGH (ref 0.0–1.2)
Calcium: 9.7 mg/dL (ref 8.7–10.2)
Chloride: 102 mmol/L (ref 96–106)
Chol/HDL Ratio: 3.4 ratio (ref 0.0–5.0)
Cholesterol, Total: 121 mg/dL (ref 100–199)
Creatinine, Ser: 0.88 mg/dL (ref 0.76–1.27)
EOS (ABSOLUTE): 0.1 10*3/uL (ref 0.0–0.4)
Eos: 1 %
Estimated CHD Risk: 0.5 times avg. (ref 0.0–1.0)
Free Thyroxine Index: 0.6 — ABNORMAL LOW (ref 1.2–4.9)
GGT: 18 IU/L (ref 0–65)
Globulin, Total: 1.9 g/dL (ref 1.5–4.5)
Glucose: 96 mg/dL (ref 70–99)
HDL: 36 mg/dL — ABNORMAL LOW (ref >39)
Hematocrit: 46.7 % (ref 37.5–51.0)
Hemoglobin: 16.5 g/dL (ref 13.0–17.7)
Immature Grans (Abs): 0 10*3/uL (ref 0.0–0.1)
Immature Granulocytes: 0 %
Iron: 161 ug/dL (ref 38–169)
LDH: 185 IU/L (ref 121–224)
LDL Chol Calc (NIH): 68 mg/dL (ref 0–99)
Lymphocytes Absolute: 2.6 10*3/uL (ref 0.7–3.1)
Lymphs: 36 %
MCH: 30.2 pg (ref 26.6–33.0)
MCHC: 35.3 g/dL (ref 31.5–35.7)
MCV: 86 fL (ref 79–97)
Monocytes Absolute: 0.6 10*3/uL (ref 0.1–0.9)
Monocytes: 9 %
Neutrophils Absolute: 4 10*3/uL (ref 1.4–7.0)
Neutrophils: 54 %
Phosphorus: 4.1 mg/dL (ref 2.8–4.1)
Platelets: 245 10*3/uL (ref 150–450)
Potassium: 4.1 mmol/L (ref 3.5–5.2)
RBC: 5.46 x10E6/uL (ref 4.14–5.80)
RDW: 12 % (ref 11.6–15.4)
Sodium: 139 mmol/L (ref 134–144)
T3 Uptake Ratio: 23 % — ABNORMAL LOW (ref 24–39)
T4, Total: 2.7 ug/dL — ABNORMAL LOW (ref 4.5–12.0)
TSH: <0.005 u[IU]/mL — ABNORMAL LOW (ref 0.450–4.500)
Total Protein: 6.7 g/dL (ref 6.0–8.5)
Triglycerides: 86 mg/dL (ref 0–149)
Uric Acid: 5.7 mg/dL (ref 3.8–8.4)
VLDL Cholesterol Cal: 17 mg/dL (ref 5–40)
WBC: 7.4 10*3/uL (ref 3.4–10.8)
eGFR: 121 mL/min/{1.73_m2} (ref >59)

## 2021-09-04 NOTE — Progress Notes (Signed)
[facility]  ____________________________________________   None    (approximate)  I have reviewed the triage vital signs and the nursing notes.   HISTORY  Chief Complaint Annual Exam    HPI Justin Brooks is a 26 y.o. male patient presents for annual physical exam.  Patient voices no concerns or complaints.         Past Medical History:  Diagnosis Date   Asthma    Pilonidal cyst with abscess 06/04/2020    Patient Active Problem List   Diagnosis Date Noted   Pilonidal cyst with abscess 06/04/2020   Contact dermatitis and other eczema due to plants (except food) 01/10/2009    No past surgical history on file.  Prior to Admission medications   Medication Sig Start Date End Date Taking? Authorizing Provider  sulfamethoxazole-trimethoprim (BACTRIM DS) 800-160 MG tablet Take 1 tablet by mouth 2 (two) times daily. 07/17/21   Sable Feil, PA-C    Allergies Patient has no known allergies.  No family history on file.  Social History Social History   Tobacco Use   Smoking status: Former    Types: Cigarettes   Smokeless tobacco: Current    Types: Chew  Substance Use Topics   Alcohol use: Not Currently    Review of Systems  Constitutional: No fever/chills Eyes: No visual changes. ENT: No sore throat. Cardiovascular: Denies chest pain. Respiratory: Denies shortness of breath. Gastrointestinal: No abdominal pain.  No nausea, no vomiting.  No diarrhea.  No constipation. Genitourinary: Negative for dysuria. Musculoskeletal: Negative for back pain. Skin: Negative for rash. Neurological: Negative for headaches, focal weakness or numbness.   ____________________________________________   PHYSICAL EXAM:  VITAL SIGNS: BP is 135/76, pulse 78, respirations 16, temperature 97.8, patient 99% O2 sat on room air.  Patient weighs 205 pounds and BMI is 27.80. Constitutional: Alert and oriented. Well appearing and in no acute  distress. Eyes: Conjunctivae are normal. PERRL. EOMI. Head: Atraumatic. Nose: No congestion/rhinnorhea. Mouth/Throat: Mucous membranes are moist.  Oropharynx non-erythematous. Neck: No stridor.  No cervical spine tenderness to palpation. Hematological/Lymphatic/Immunilogical: No cervical lymphadenopathy. Cardiovascular: Normal rate, regular rhythm. Grossly normal heart sounds.  Good peripheral circulation. Respiratory: Normal respiratory effort.  No retractions. Lungs CTAB. Gastrointestinal: Soft and nontender. No distention. No abdominal bruits. No CVA tenderness. Genitourinary: Deferred Musculoskeletal: No lower extremity tenderness nor edema.  No joint effusions. Neurologic:  Normal speech and language. No gross focal neurologic deficits are appreciated. No gait instability. Skin:  Skin is warm, dry and intact. No rash noted. Psychiatric: Mood and affect are normal. Speech and behavior are normal.  ____________________________________________   LABS         Component Ref Range & Units 1 d ago (09/03/21) 1 yr ago (08/23/20) 2 yr ago (07/25/19) 3 yr ago (12/10/17) 3 yr ago (12/10/17)  Glucose 70 - 99 mg/dL 96  90 R  97 R  96    Uric Acid 3.8 - 8.4 mg/dL 5.7  4.5 CM  4.7 CM     Comment:            Therapeutic target for gout patients: <6.0  BUN 6 - 20 mg/dL 12  22 High   19  18    Creatinine, Ser 0.76 - 1.27 mg/dL 0.88  0.82  1.13  1.38 High  R    eGFR >59 mL/min/1.73 121  124      BUN/Creatinine Ratio 9 - '20 14  27 ' High   17  Sodium 134 - 144 mmol/L 139  137  138  140 R    Potassium 3.5 - 5.2 mmol/L 4.1  4.1  3.9  3.4 Low  R    Chloride 96 - 106 mmol/L 102  98  98  104 R    Calcium 8.7 - 10.2 mg/dL 9.7  9.1  9.3  9.4 R    Phosphorus 2.8 - 4.1 mg/dL 4.1  3.7  4.4 High      Total Protein 6.0 - 8.5 g/dL 6.7  6.8  6.9  7.6 R    Albumin 4.1 - 5.2 g/dL 4.8  4.8  4.8  5.1 High  R    Comment:                **Effective September 15, 2021 Albumin reference interval**                   will  be changing to:                              Age                  Male          Male                             0 -   7 days       3.6 - 4.9      3.6 - 4.9                             8 -  30 days       3.5 - 4.6      3.5 - 4.6                             1 -   6 months     3.7 - 4.8      3.7 - 4.8                      7 months -   2 years      4.0 - 5.0      4.0 - 5.0                             3 -   5 years      4.1 - 5.0      4.1 - 5.0                             6 -  12 years      4.2 - 5.0      4.2 - 5.0                            13 -  30 years      4.3 - 5.2      4.0 - 5.0                            31 -  50 years      4.1 -  5.1      3.9 - 4.9                            51 -  60 years      3.8 - 4.9      3.8 - 4.9                            61 -  70 years      3.9 - 4.9      3.9 - 4.9                            71 -  80 years      3.8 - 4.8      3.8 - 4.8                            81 -  89 years      3.7 - 4.7      3.7 - 4.7                            90 - 199 years      3.6 - 4.6      3.6 - 4.6   Globulin, Total 1.5 - 4.5 g/dL 1.9  2.0  2.1     Albumin/Globulin Ratio 1.2 - 2.2 2.5 High   2.4 High   2.3 High      Bilirubin Total 0.0 - 1.2 mg/dL 3.5 High   2.7 High   2.1 High   2.0 High  R    Comment: Note: Specimen is icteric.  Alkaline Phosphatase 44 - 121 IU/L 93  80  64 R, CM  63 R    LDH 121 - 224 IU/L 185  175  180     AST 0 - 40 IU/L '21  22  15  26 ' R    ALT 0 - 44 IU/L 37  38  24  31 R    GGT 0 - 65 IU/L '18  18  21     ' Iron 38 - 169 ug/dL 161  85  94     Cholesterol, Total 100 - 199 mg/dL 121  159  135     Triglycerides 0 - 149 mg/dL 86  65  97     HDL >39 mg/dL 36 Low   47  46     VLDL Cholesterol Cal 5 - 40 mg/dL '17  13  18     ' LDL Chol Calc (NIH) 0 - 99 mg/dL 68  99  71     Chol/HDL Ratio 0.0 - 5.0 ratio 3.4  3.4 CM  2.9 CM     Comment:                                   T. Chol/HDL Ratio                                              Men  Women  1/2 Avg.Risk  3.4    3.3                                    Avg.Risk  5.0    4.4                                 2X Avg.Risk  9.6    7.1                                 3X Avg.Risk 23.4   11.0   Estimated CHD Risk 0.0 - 1.0 times avg. 0.5  0.5 CM   < 0.5 CM     Comment: The CHD Risk is based on the T. Chol/HDL ratio. Other  factors affect CHD Risk such as hypertension, smoking,  diabetes, severe obesity, and family history of  premature CHD.   TSH 0.450 - 4.500 uIU/mL <0.005 Low   1.820  1.660     T4, Total 4.5 - 12.0 ug/dL 2.7 Low   6.8  5.7     T3 Uptake Ratio 24 - 39 % 23 Low   29  26     Free Thyroxine Index 1.2 - 4.9 0.6 Low   2.0  1.5     WBC 3.4 - 10.8 x10E3/uL 7.4  7.3  7.6   9.6 R   RBC 4.14 - 5.80 x10E6/uL 5.46  5.25  4.82   5.13 R   Hemoglobin 13.0 - 17.7 g/dL 16.5  16.4  15.0   15.9 R   Hematocrit 37.5 - 51.0 % 46.7  46.0  42.5   46.1 R   MCV 79 - 97 fL 86  88  88   89.9 R   MCH 26.6 - 33.0 pg 30.2  31.2  31.1   31.0 R   MCHC 31.5 - 35.7 g/dL 35.3  35.7  35.3   34.5 R   RDW 11.6 - 15.4 % 12.0  12.6  12.4   13.2 R   Platelets 150 - 450 x10E3/uL 245  237  232   237 R   Neutrophils Not Estab. % 54  53  73   70 R   Lymphs Not Estab. % 36  34  20     Monocytes Not Estab. % '9  8  5     ' Eos Not Estab. % '1  4  1     ' Basos Not Estab. % 0  1  1     Neutrophils Absolute 1.4 - 7.0 x10E3/uL 4.0  3.8  5.5   6.7 High  R   Lymphocytes Absolute 0.7 - 3.1 x10E3/uL 2.6  2.5  1.5   2.2 R   Monocytes Absolute 0.1 - 0.9 x10E3/uL 0.6  0.6  0.4     EOS (ABSOLUTE) 0.0 - 0.4 x10E3/uL 0.1  0.3  0.1     Basophils Absolute 0.0 - 0.2 x10E3/uL 0.0  0.1  0.1   0.1 R, CM   Immature Granulocytes Not Estab. % 0  0  0     Immature Grans            ____________________________________________  ____________________________________________    ____________________________________________   INITIAL IMPRESSION / ASSESSMENT AND PLAN  As part of my medical decision making, I reviewed  the  following data within the [facility]       Discussed with patient lab results which shows that continue mild elevation of bilirubin.  Patient also has decreased TSH.  We will continue to monitor.    ____________________________________________   FINAL CLINICAL IMPRESSION Well exam    ED Discharge Orders     None        Note:  This document was prepared using Dragon voice recognition software and may include unintentional dictation errors.
